# Patient Record
Sex: Female | Born: 1955 | Race: White | Hispanic: Refuse to answer | Marital: Married | Smoking: Never smoker
Health system: Southern US, Community
[De-identification: ages and names within clinical notes are randomized; demographics above are authoritative.]

## PROBLEM LIST (undated history)

## (undated) DIAGNOSIS — H509 Unspecified strabismus: Secondary | ICD-10-CM

## (undated) DIAGNOSIS — M1990 Unspecified osteoarthritis, unspecified site: Secondary | ICD-10-CM

## (undated) DIAGNOSIS — F419 Anxiety disorder, unspecified: Secondary | ICD-10-CM

## (undated) HISTORY — PX: DILATION AND CURETTAGE OF UTERUS: SHX78

---

## 1998-09-08 HISTORY — PX: STRABISMUS SURGERY: SHX218

## 2010-05-24 ENCOUNTER — Ambulatory Visit: Payer: Self-pay | Admitting: Internal Medicine

## 2010-05-24 DIAGNOSIS — J309 Allergic rhinitis, unspecified: Secondary | ICD-10-CM | POA: Insufficient documentation

## 2010-05-24 DIAGNOSIS — R5381 Other malaise: Secondary | ICD-10-CM

## 2010-05-24 DIAGNOSIS — G4700 Insomnia, unspecified: Secondary | ICD-10-CM

## 2010-05-24 DIAGNOSIS — R5383 Other fatigue: Secondary | ICD-10-CM

## 2010-05-24 DIAGNOSIS — F411 Generalized anxiety disorder: Secondary | ICD-10-CM | POA: Insufficient documentation

## 2010-05-27 LAB — CONVERTED CEMR LAB
Basophils Absolute: 0 10*3/uL (ref 0.0–0.1)
Basophils Relative: 0.2 % (ref 0.0–3.0)
Calcium: 9.8 mg/dL (ref 8.4–10.5)
Creatinine, Ser: 0.6 mg/dL (ref 0.4–1.2)
GFR calc non Af Amer: 117.7 mL/min (ref >60)
Hemoglobin: 14.7 g/dL (ref 12.0–15.0)
Lymphocytes Relative: 25.3 % (ref 12.0–46.0)
MCHC: 34.1 g/dL (ref 30.0–36.0)
Monocytes Relative: 10.4 % (ref 3.0–12.0)
Neutro Abs: 3.1 10*3/uL (ref 1.4–7.7)
Neutrophils Relative %: 64 % (ref 43.0–77.0)
RBC: 4.79 M/uL (ref 3.87–5.11)
Sodium: 142 meq/L (ref 135–145)
TSH: 0.88 microintl units/mL (ref 0.35–5.50)
WBC: 4.9 10*3/uL (ref 4.5–10.5)

## 2010-06-04 ENCOUNTER — Telehealth: Payer: Self-pay | Admitting: Internal Medicine

## 2010-06-12 ENCOUNTER — Telehealth: Payer: Self-pay | Admitting: Internal Medicine

## 2010-06-25 ENCOUNTER — Encounter: Payer: Self-pay | Admitting: Internal Medicine

## 2010-06-28 ENCOUNTER — Encounter: Admission: RE | Admit: 2010-06-28 | Discharge: 2010-06-28 | Payer: Self-pay | Admitting: Internal Medicine

## 2010-07-17 ENCOUNTER — Encounter: Payer: Self-pay | Admitting: Internal Medicine

## 2010-07-22 ENCOUNTER — Encounter: Admission: RE | Admit: 2010-07-22 | Discharge: 2010-07-22 | Payer: Self-pay | Admitting: Internal Medicine

## 2010-10-08 NOTE — Consult Note (Signed)
Summary: [facility]  [facility]   Imported By: Lanelle Bal 08/02/2010 10:00:55  _____________________________________________________________________  External Attachment:    Type:   Image     Comment:   External Document

## 2010-10-08 NOTE — Progress Notes (Signed)
Summary: zolpidem  Phone Note Call from Patient Call back at Home Phone [phone]   Summary of Call: Patient left message on triage that her current psychiatrist is going back to school and she had to find a new one. She has a new one, Dr. Evelene Croon and is scheduled for appt on 10/14. In the meantime he will not refill her Zolpidem. Patient would like to know if Drue Novel will refill. Please advise. Initial call taken by: Lucious Groves CMA,  June 12, 2010 10:31 AM  Follow-up for Phone Call        ok to call  #30 and one refill Follow-up by: [facility] E. Paz MD,  June 12, 2010 11:00 AM  Additional Follow-up for Phone Call Additional follow up Details #1::        Patient notified and [facility]s [facility] [address]. Additional Follow-up by: Lucious Groves CMA,  June 12, 2010 11:15 AM    Prescriptions: AMBIEN 10 MG TABS (ZOLPIDEM TARTRATE) 1 by mouth at qhs  #30 x 1   Entered by:   Lucious Groves CMA   Authorized by:   Nolon Rod. Paz MD   Signed by:   Lucious Groves CMA on 06/12/2010   Method used:   Telephoned to ...       [facility]  [address] [phone]* (retail)       [address]       [place], [place]  [address]       Ph: [phone] or [phone]       Fax: [phone]   RxID:   [id number]

## 2010-10-08 NOTE — Progress Notes (Signed)
Summary: referral  Phone Note Call from Patient   Caller: Patient Reason for Call: Referral Summary of Call: Pt called and states that she has been using the Veramyst and her symptoms are not getting any better and the Vermyst is not coverd by her insurance. Would like to know if she can be referred to see an allergist. Please advise. Army Fossa CMA  June 04, 2010 9:33 AM   Follow-up for Phone Call        arrange referal Nolon Rod. Paz MD  June 04, 2010 11:54 AM

## 2010-10-08 NOTE — Assessment & Plan Note (Signed)
Summary: NEW TO EST/KN   Vital Signs:  Patient profile:   55 year old female Height:      69 inches Weight:      159.13 pounds BMI:     23.58 Temp:     97.7 degrees F oral Pulse rate:   72 / minute Pulse rhythm:   regular BP sitting:   120 / 70  (left arm) Cuff size:   regular  Vitals Entered By: Army Fossa CMA (May 24, 2010 1:06 PM) CC: To establish, c/o Sinus infection?  Comments c/o HA, congestion " feels as if she has been run over by a truck." - feeling is off and on. 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   History of Present Illness: new patient   2 year history of on and off sinus congestion, frontal headache every 2 to 3  weeks. She has not taking any medication for this condition, has not seen the doctor or having x-rays rarely above sx are associated w/ extreme fatigue, feeling like "run by a truck" see ROS  Also has a history of insomnia, taking Ambien for while, lately he has not been working as well as before  [employer] about the need of a tetanus shot and flu shot Like a referral to a new female gynecologist periods for the last few months has been slightly different, check hormones?    ROS Denies fevers no myalgias-arthralgias  no N-V  Admits to mild cough with occasional phlegm Some postnasal dripping Denies itchy eyes or itchy nose Reports a long history of nasal septum deviation   Preventive Screening-Counseling & Management  Alcohol-Tobacco     Alcohol type: wine- rarely     Smoking Status: never  Caffeine-Diet-Exercise     Does Patient Exercise: yes     Times/week: 3      Drug Use:  no.    Current Medications (verified): 1)  Klonopin 1 Mg Tabs (Clonazepam) .... As Needed For Anxiety. 2)  Ambien 10 Mg Tabs (Zolpidem Tartrate) .Marland Kitchen.. 1 By Mouth At Qhs  Allergies (verified): No Known Drug Allergies  Past History:  Past Medical  History: very healthy  Anxiety  Past Surgical History: no major surgery   Family History: DM--no HTN--no high chol--no colon ca--no breast ca--no  Social History: Married no children  born in [place], family from [place]  moved from [place] , 2011 Never Smoked Alcohol use-yes, rarely diet-- veggan  Drug use-no Regular exercise-yes Smoking Status:  never Drug Use:  no Does Patient Exercise:  yes  Physical Exam  General:  alert, well-developed, and well-nourished.   Head:  face symmetric, not tender to palpation at the maxillary or frontal sinuses Ears:  R ear normal and L ear normal.   Nose:  not congested, no discharge, septum slightly deviated to the left Mouth:  no redness or discharge Lungs:  normal respiratory effort, no intercostal retractions, no accessory muscle use, and normal breath sounds.   Heart:  normal rate, regular rhythm, and no murmur.     Impression & Recommendations:  Problem # 1:  ALLERGIC RHINITIS (ICD-477.9) Chronic nasal congestion on and off She has pets at home  for a long time She recently moved from [facility] to [place] without a change in her symptoms Most likely she has allergies, we'll try veramyst  and Claritin consider a referral if not better  Her updated medication list for this  problem includes:    Veramyst 27.5 Mcg/spray Susp (Fluticasone furoate) .Marland Kitchen... 2 sprays once daily    Claritin 10 Mg Tabs (Loratadine) .Marland Kitchen... 1 by mouth daily at night  Problem # 2:  ? of MENOPAUSE, EARLY (ICD-627.2) refer to gynecology  Problem # 3:  ROUTINE GENERAL MEDICAL EXAM@HEALTH  CARE FACL (ICD-V70.0)  TD today Flu shot today referred to gynecology Request a mammogram referral as well  Orders: Gynecologic Referral (Gyn) Radiology Referral (Radiology)  Problem # 4:  ANXIETY (ICD-300.00) symptoms apparently well controlled Her updated medication list for this problem includes:    Klonopin 1 Mg Tabs (Clonazepam) .Marland Kitchen... As needed for  anxiety.  Problem # 5:  INSOMNIA-SLEEP DISORDER-UNSPEC (ICD-780.52) on Ambien for while, apparently not as effective as before. She was stressed out because her recent move from [place] to [place] but lately she is settling down. Gave information about healthy sleep habits Her updated medication list for this problem includes:    Ambien 10 Mg Tabs (Zolpidem tartrate) .Marland Kitchen... 1 by mouth at qhs  Problem # 6:  FATIGUE (ICD-780.79)  see HPI, unclear etiology of episodic fatigue will get labs and re asses on RTC   Orders: Venipuncture (16109) TLB-BMP (Basic Metabolic Panel-BMET) (80048-METABOL) TLB-CBC Platelet - w/Differential (85025-CBCD) TLB-TSH (Thyroid Stimulating Hormone) (84443-TSH) Specimen Handling (60454)  Complete Medication List: 1)  Klonopin 1 Mg Tabs (Clonazepam) .... As needed for anxiety. 2)  Ambien 10 Mg Tabs (Zolpidem tartrate) .Marland Kitchen.. 1 by mouth at qhs 3)  Veramyst 27.5 Mcg/spray Susp (Fluticasone furoate) .... 2 sprays once daily 4)  Claritin 10 Mg Tabs (Loratadine) .Marland Kitchen.. 1 by mouth daily at night  Other Orders: Tdap => 29yrs IM (09811) Admin 1st Vaccine (91478) Admin 1st Vaccine (29562) Flu Vaccine 80yrs + [phone])   Patient Instructions: 1)  call in to 4 to 6 weeks if your sinus symptoms are no better 2)  Please schedule a follow-up appointment in 3 to 4 months fasting, physical exam Prescriptions: VERAMYST 27.5 MCG/SPRAY SUSP (FLUTICASONE FUROATE) 2 sprays once daily  #1 x 6   Entered and Authorized by:   Elita Quick E. Shontae Rosiles MD   Signed by:   Nolon Rod. Maycel Riffe MD on 05/24/2010   Method used:   Print then Give to Patient   RxID:   [id number]    Risk Factors:  Tobacco use:  never Drug use:  no Alcohol use:  yes    Type:  wine- rarely Exercise:  yes    Times per week:  3    Immunizations Administered:  Tetanus Vaccine:    Vaccine Type: Tdap    Site: right deltoid    Mfr: GlaxoSmithKline    Dose: 0.5 ml    Route: IM    Given by: Army Fossa CMA     Exp. Date: 05/29/2012    Lot #: [id number] Flu Vaccine Consent Questions     Do you have a history of severe allergic reactions to this vaccine? no    Any prior history of allergic reactions to egg and/or gelatin? no    Do you have a sensitivity to the preservative Thimersol? no    Do you have a past history of Guillan-Barre Syndrome? no    Do you currently have an acute febrile illness? no    Have you ever had a severe reaction to latex? no    Vaccine information given and explained to patient? yes    Are you currently pregnant? no    Lot Number:[id number]   Exp Date:03/08/2011  Site Given  Left Deltoid IMu

## 2011-01-14 ENCOUNTER — Other Ambulatory Visit: Payer: Self-pay | Admitting: Obstetrics and Gynecology

## 2011-01-14 ENCOUNTER — Encounter (HOSPITAL_COMMUNITY): Payer: BC Managed Care – PPO

## 2011-01-14 LAB — BASIC METABOLIC PANEL
BUN: 11 mg/dL (ref 6–23)
CO2: 26 mEq/L (ref 19–32)
Calcium: 9 mg/dL (ref 8.4–10.5)
Creatinine, Ser: 0.62 mg/dL (ref 0.4–1.2)
GFR calc Af Amer: >60 mL/min (ref >60)
Glucose, Bld: 93 mg/dL (ref 70–99)

## 2011-01-14 LAB — CBC
HCT: 39.8 % (ref 36.0–46.0)
MCHC: 32.4 g/dL (ref 30.0–36.0)
RDW: 12.8 % (ref 11.5–15.5)

## 2011-01-14 LAB — URINALYSIS, ROUTINE W REFLEX MICROSCOPIC
Bilirubin Urine: NEGATIVE
Hgb urine dipstick: NEGATIVE
Ketones, ur: NEGATIVE mg/dL
Protein, ur: NEGATIVE mg/dL
Urobilinogen, UA: 0.2 mg/dL (ref 0.0–1.0)

## 2011-01-21 ENCOUNTER — Ambulatory Visit (HOSPITAL_COMMUNITY)
Admission: RE | Admit: 2011-01-21 | Discharge: 2011-01-21 | Disposition: A | Discharge status: Home / Self Care | Payer: BC Managed Care – PPO | Source: Ambulatory Visit | Attending: Obstetrics and Gynecology | Admitting: Obstetrics and Gynecology

## 2011-01-21 ENCOUNTER — Other Ambulatory Visit: Payer: Self-pay

## 2011-01-21 DIAGNOSIS — Z01812 Encounter for preprocedural laboratory examination: Secondary | ICD-10-CM | POA: Insufficient documentation

## 2011-01-21 DIAGNOSIS — D250 Submucous leiomyoma of uterus: Secondary | ICD-10-CM | POA: Insufficient documentation

## 2011-01-21 DIAGNOSIS — N950 Postmenopausal bleeding: Secondary | ICD-10-CM | POA: Insufficient documentation

## 2011-01-21 DIAGNOSIS — Z01818 Encounter for other preprocedural examination: Secondary | ICD-10-CM | POA: Insufficient documentation

## 2011-02-15 NOTE — Op Note (Signed)
NAMEARIELIZ, LATINO NO.:  [id number]  MEDICAL RECORD NO.:  [id number]           PATIENT TYPE:  O  LOCATION:  [facility]                          [facility]  PHYSICIAN:  Randye Lobo, M.D.   DATE OF BIRTH:  Mar 04, 1956  DATE OF PROCEDURE:  01/21/2011 DATE OF DISCHARGE:                              OPERATIVE REPORT   PREOPERATIVE DIAGNOSIS:  Postmenopausal bleeding.  POSTOPERATIVE DIAGNOSES: 1. Postmenopausal bleeding. 2. Submucosal fibroid.  PROCEDURES:  Hysteroscopy, dilation and curettage, and resection of submucosal fibroid.  SURGEON:  Randye Lobo, MD  ANESTHESIA:  General endotracheal.  INTRAVENOUS FLUIDS:  2000 mL Ringer's lactate.  ESTIMATED BLOOD LOSS:  Minimal.  URINE OUTPUT:  I and O catheterization prior to procedure, 1.5% glycine, deficit 255 mL.  COMPLICATIONS:  None.  INDICATIONS FOR PROCEDURE:  The patient is a 55 year old, gravida 1, para 0-0-1-0 Caucasian female who presented with postmenopausal bleeding.  The patient was seen in the office were she had initiation of estrogen and progesterone hormone therapy for menopausal symptoms and very soon thereafter when she developed an episode of very heavy vaginal bleeding.  Ultrasound in the office on December 09, 2010, documented echogenic areas of the endometrium which were thought to be consistent with either polyps or clot within the endometrial cavity.  She also had a 4-cm posterior lower uterine segment fibroid and a complex mostly solid the area of the right ovary.  An attempt was made to perform an endometrial biopsy in the office, however, this procedure was abandoned due to a stenotic cervix.  The patient had a followup ultrasound on Jan 16, 2011, which documented an endometrial stripe of 17 mm with complex fluid in the endometrial canal.  The fibroid was still visible and both of the ovaries appeared normal at this time.  A plan was made to proceed with a hysteroscopy with  dilation and curettage after risks, benefits, and alternatives were reviewed.  FINDINGS:  Examination under anesthesia revealed a small retroverted mobile uterus.  No adnexal masses were appreciated.  Hysteroscopy demonstrated a 2-cm bilobed submucosal fibroid which was attached to the posterior and left fundus.  After resection of the fibroid, there was no evidence of any intracavitary pathology.  The endocervix was normal.  SPECIMENS:  Fragments of the uterine fibroid were sent to Pathology separately from the endometrial curetting specimen.  PROCEDURE:  The patient was reidentified in the preoperative hold area. She received cefotetan IV for antibiotic prophylaxis and she received TED hose for DVT prophylaxis.  In the operating room, general endotracheal anesthesia was induced.  The patient was placed in the dorsal lithotomy position.  The lower abdomen, vagina, and perineum were then sterilely prepped and she was in-and-out catheterized of urine.  The patient was then sterilely draped.  An exam under anesthesia was performed.  A speculum was placed inside the vagina.  A single-tooth tenaculum was placed on the anterior cervical lip.  The cervix was dilated to a #25 Pratt dilator.  The 2-mm diagnostic hysteroscope was inserted with a continuous infusion of glycine solution.  The findings were as noted above.  The cervix was dilated further  in order to allow of a ring forceps to attempt to remove the intracavitary lesion which initially it was not clear if it was a polyp or fibroid.  Due to the solid nature of the texture of the intracavitary lesion, the cervix was then dilated up to a #31 Pratt dilators so that the resectoscope could be placed inside the uterine cavity.  This was done so under the continuous infusion of 1.5% glycine solution.  A resectoscopic loop and cutting setting were then used to shave the pieces of intracavitary fibroid until the entire lesion had been  resected.  The resectoscope was removed and the fragments were extracted using a polyp forceps.  The specimen was sent to Pathology. The resectoscope was inserted one final time and there were no remaining pieces of fibroid present.  A serrated curette was then used to curette the endometrium in the areas which did not involve the fibroid.  The endometrial curettings were sent to Pathology separately.  The cervix was examined at this time and the small laceration was repaired with a figure-of-eight suture of 0 chromic, which created excellent hemostasis.  The procedure was concluded.  The patient was awakened and extubated. She was escorted to the recovery room in stable condition.  There were no complications to the procedure.  All needle, instrument, and sponge counts were correct.     Randye Lobo, M.D.     BES/MEDQ  D:  01/21/2011  T:  01/21/2011  Job:  [id number]  Electronically Signed by Conley Simmonds M.D. on 02/15/2011 01:26:55 PM

## 2012-04-02 ENCOUNTER — Other Ambulatory Visit: Payer: Self-pay

## 2012-05-03 ENCOUNTER — Other Ambulatory Visit: Payer: Self-pay | Admitting: Obstetrics and Gynecology

## 2012-05-03 DIAGNOSIS — R928 Other abnormal and inconclusive findings on diagnostic imaging of breast: Secondary | ICD-10-CM

## 2012-05-06 ENCOUNTER — Ambulatory Visit
Admission: RE | Admit: 2012-05-06 | Discharge: 2012-05-06 | Disposition: A | Discharge status: Home / Self Care | Payer: 59 | Source: Ambulatory Visit | Attending: Obstetrics and Gynecology | Admitting: Obstetrics and Gynecology

## 2012-05-06 DIAGNOSIS — R928 Other abnormal and inconclusive findings on diagnostic imaging of breast: Secondary | ICD-10-CM

## 2012-05-07 IMAGING — US BILAT BREASTS
1 series · 5 of 5 positions shown · non-contrast
Comparison: 03/02/2008, 03/02/2007, 12/11/2003 from [facility]
Radiology in [place], [place]

CLINICAL DATA: The patient returns for evaluation of a possible
mass in each breast noted on recent screening study dated
06/28/2010.

DIGITAL DIAGNOSTIC BILATERAL LIMITED MAMMOGRAM  AND BILATERAL
BREAST ULTRASOUND:

[Series 1: bilat breasts · 5 of 5 slices shown]
[im 1/5]
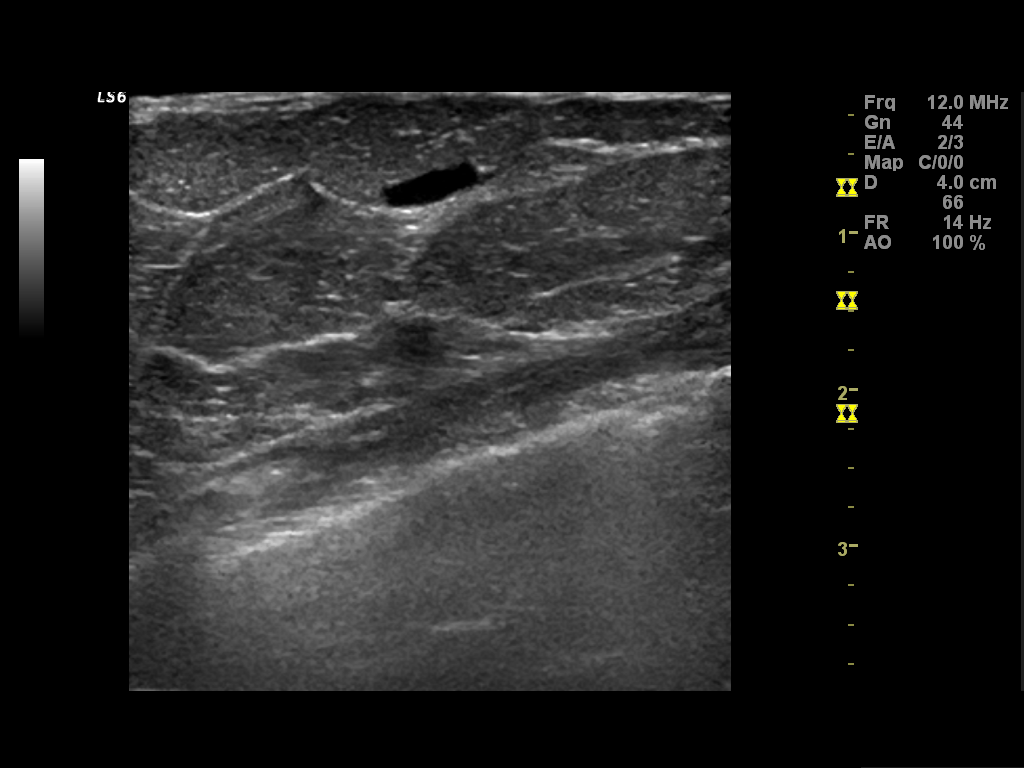
[im 2/5]
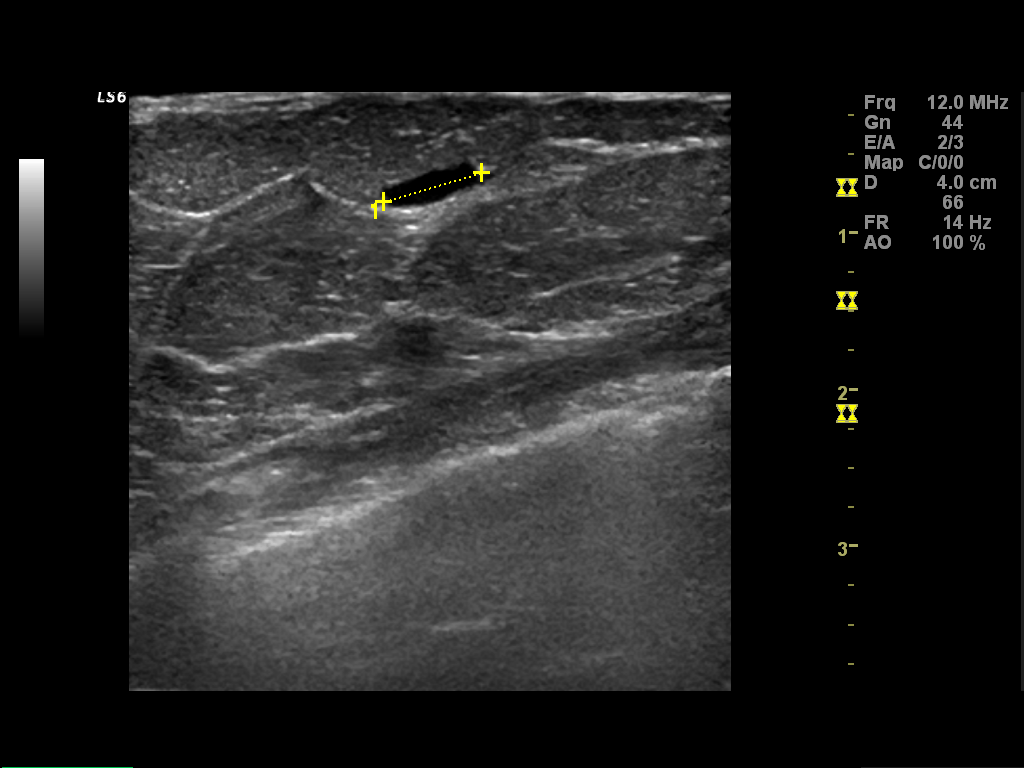
[im 3/5]
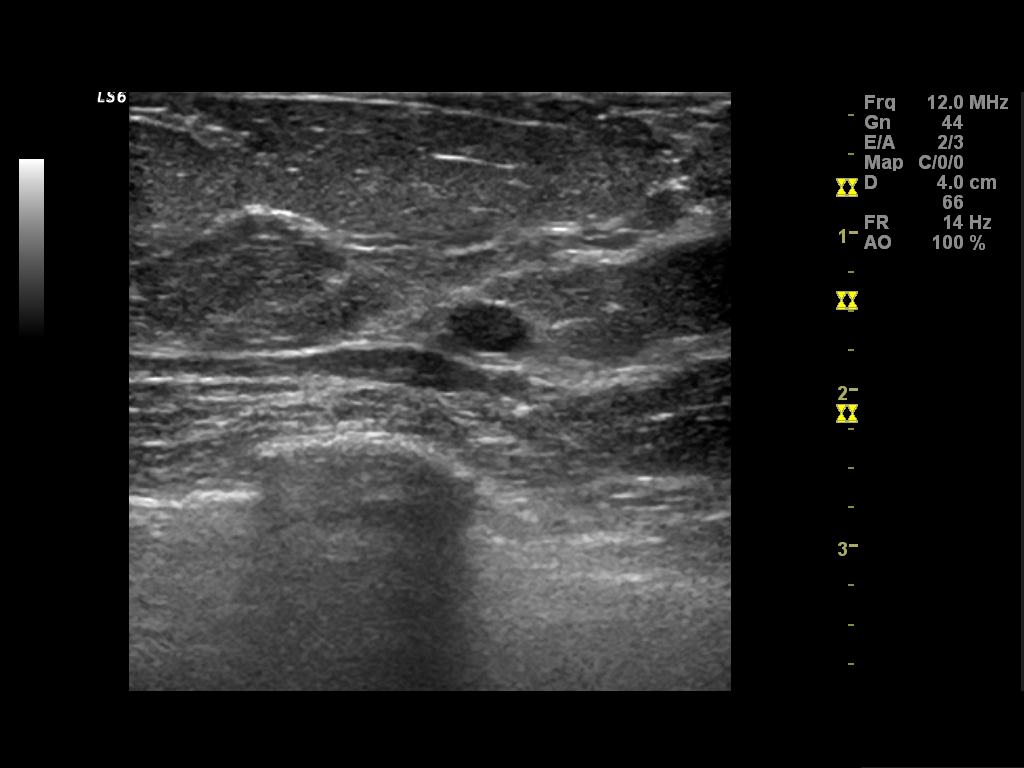
[im 4/5]
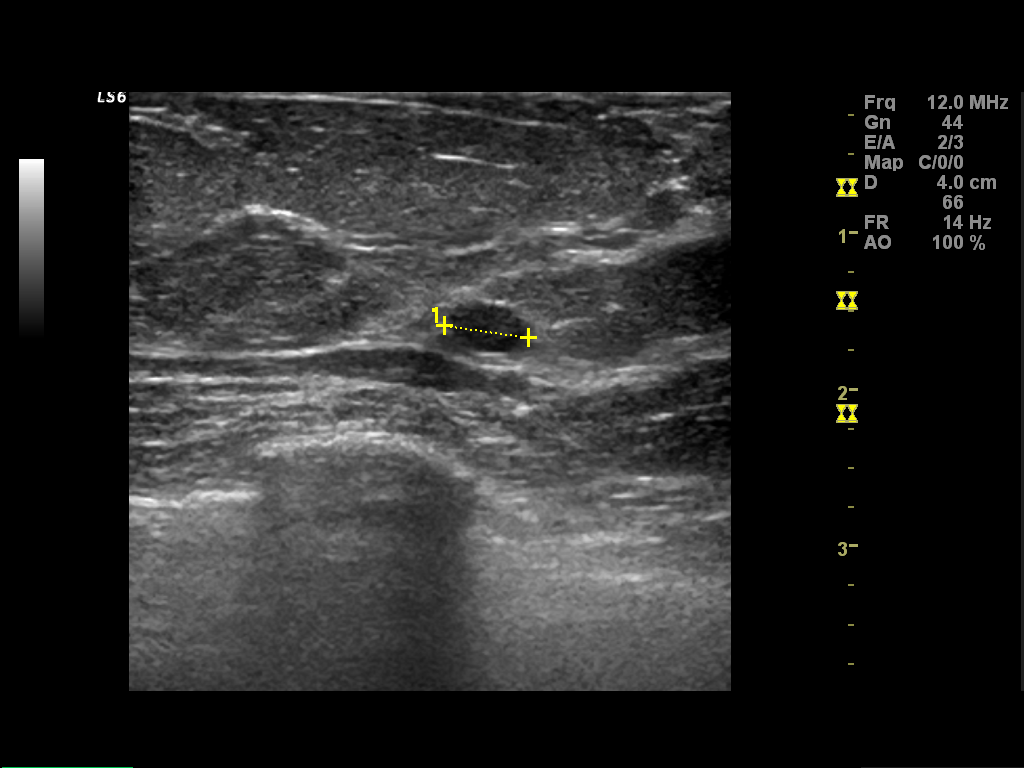
[im 5/5]
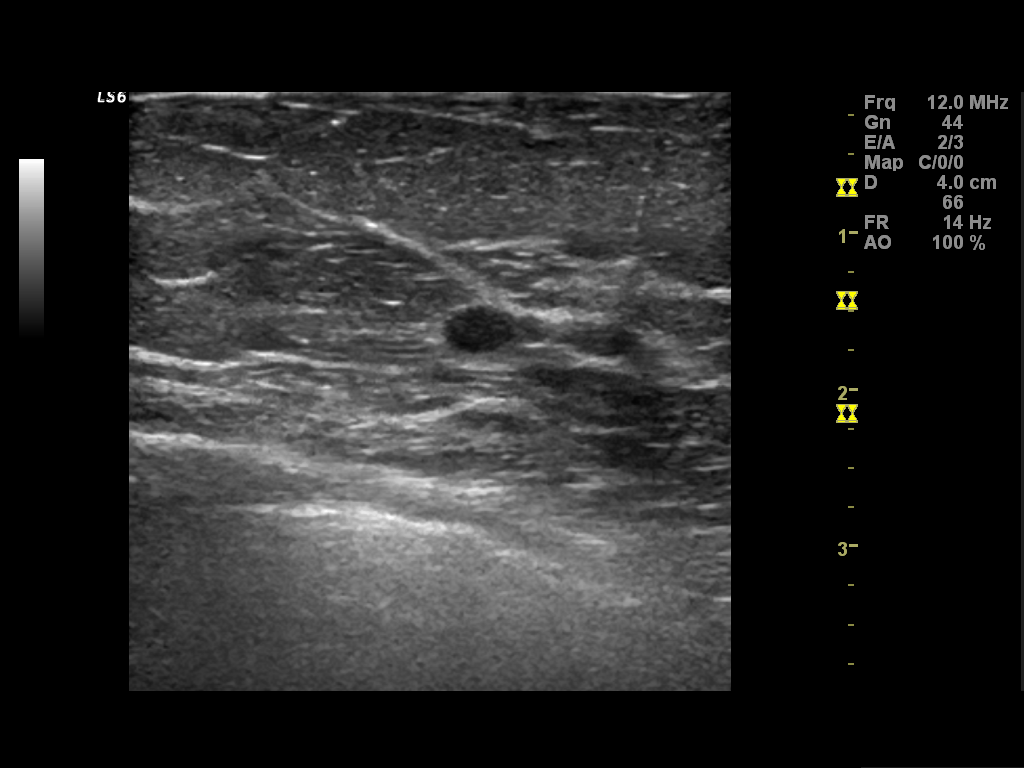

[5 of 5 positions shown; findings below may reference images not displayed]

FINDINGS: Spot compression views confirm the presence of a small
oval nodule in the right lower inner quadrant and in the left upper
inner quadrant.

On physical exam, no mass is palpated in either breast.

Ultrasound is performed, showing a 7 mm cyst at 4 o'clock 5 cm from
the right nipple and a 5 mm cyst at 10 o'clock 4 cm from the left
nipple.  There is no solid mass, distortion or shadowing to suggest
malignancy.
IMPRESSION: Cyst in each breast.  No mammographic or sonographic evidence of
malignancy.  Yearly screening mammography is suggested.

BI-RADS CATEGORY 2:  Benign finding(s).

## 2013-09-08 HISTORY — PX: EYE SURGERY: SHX253

## 2014-01-13 ENCOUNTER — Other Ambulatory Visit: Payer: Self-pay

## 2014-08-07 ENCOUNTER — Ambulatory Visit: Payer: Self-pay | Admitting: Ophthalmology

## 2014-08-07 NOTE — H&P (Signed)
  Date of examination:  07-19-14  Indication for surgery: To straighten the eyes and allow some binocularity and relieve diplopia    Pertinent past medical history: No past medical history on file.   Anxiety  Pertinent ocular history:  Hx high myopia, s/p refractive lensectomy.  Hx strabismus surgery 2000, details unavailable.  Pertinent family history: No family history on file.  General:  Healthy appearing patient in no distress.    Eyes:    Acuity [place]  OD 20/25  OS 20/40  External: Within normal limits     Anterior segment: Within normal limits x PC IOL OU     Motility:   E(T)=12, E(T)' with readers = 8.  Trial of prism likes 12 BO best  Fundus: deferred  Refraction:  low minus ou   Heart: Regular rate and rhythm without murmur     Lungs: Clear to auscultation     Abdomen: Soft, nontender, normal bowel sounds     Impression:Esotropia, ? Residual vs. Consecutive, s/p previous strabismus surgery details unavailable  Plan: LLR resect (tentative)  Derry Skill

## 2014-08-09 ENCOUNTER — Encounter (HOSPITAL_BASED_OUTPATIENT_CLINIC_OR_DEPARTMENT_OTHER): Payer: Self-pay | Admitting: *Deleted

## 2014-08-11 ENCOUNTER — Ambulatory Visit (HOSPITAL_BASED_OUTPATIENT_CLINIC_OR_DEPARTMENT_OTHER)
Admission: RE | Admit: 2014-08-11 | Discharge: 2014-08-11 | Disposition: A | Discharge status: Home / Self Care | Payer: 59 | Source: Ambulatory Visit | Attending: Ophthalmology | Admitting: Ophthalmology

## 2014-08-11 ENCOUNTER — Encounter (HOSPITAL_BASED_OUTPATIENT_CLINIC_OR_DEPARTMENT_OTHER): Payer: Self-pay | Admitting: *Deleted

## 2014-08-11 ENCOUNTER — Ambulatory Visit (HOSPITAL_BASED_OUTPATIENT_CLINIC_OR_DEPARTMENT_OTHER): Payer: 59 | Admitting: Anesthesiology

## 2014-08-11 ENCOUNTER — Encounter (HOSPITAL_BASED_OUTPATIENT_CLINIC_OR_DEPARTMENT_OTHER)
Admission: RE | Disposition: A | Discharge status: Home / Self Care | Payer: Self-pay | Source: Ambulatory Visit | Attending: Ophthalmology

## 2014-08-11 DIAGNOSIS — H5000 Unspecified esotropia: Secondary | ICD-10-CM | POA: Diagnosis present

## 2014-08-11 DIAGNOSIS — H532 Diplopia: Secondary | ICD-10-CM | POA: Diagnosis not present

## 2014-08-11 DIAGNOSIS — F419 Anxiety disorder, unspecified: Secondary | ICD-10-CM | POA: Diagnosis not present

## 2014-08-11 HISTORY — DX: Unspecified strabismus: H50.9

## 2014-08-11 HISTORY — DX: Unspecified osteoarthritis, unspecified site: M19.90

## 2014-08-11 HISTORY — DX: Anxiety disorder, unspecified: F41.9

## 2014-08-11 HISTORY — PX: STRABISMUS SURGERY: SHX218

## 2014-08-11 LAB — POCT HEMOGLOBIN-HEMACUE: HEMOGLOBIN: 15.5 g/dL — AB (ref 12.0–15.0)

## 2014-08-11 SURGERY — REPAIR STRABISMUS
Anesthesia: General | Site: Eye | Laterality: Left

## 2014-08-11 MED ORDER — ATROPINE SULFATE 0.4 MG/ML IJ SOLN
INTRAMUSCULAR | Status: DC | PRN
  Administered 2014-08-11: .2 mg via INTRAVENOUS

## 2014-08-11 MED ORDER — MIDAZOLAM HCL 5 MG/5ML IJ SOLN
INTRAMUSCULAR | Status: DC | PRN
  Administered 2014-08-11: 2 mg via INTRAVENOUS

## 2014-08-11 MED ORDER — MEPERIDINE HCL 25 MG/ML IJ SOLN: 6.2500 mg | INTRAMUSCULAR | Status: DC | PRN

## 2014-08-11 MED ORDER — HYDROMORPHONE HCL 1 MG/ML IJ SOLN
INTRAMUSCULAR | Status: AC
  Filled 2014-08-11: qty 1

## 2014-08-11 MED ORDER — FENTANYL CITRATE 0.05 MG/ML IJ SOLN: 50.0000 ug | INTRAMUSCULAR | Status: DC | PRN

## 2014-08-11 MED ORDER — FENTANYL CITRATE 0.05 MG/ML IJ SOLN
INTRAMUSCULAR | Status: DC | PRN
  Administered 2014-08-11: 100 ug via INTRAVENOUS

## 2014-08-11 MED ORDER — ONDANSETRON HCL 4 MG/2ML IJ SOLN
INTRAMUSCULAR | Status: DC | PRN
  Administered 2014-08-11: 4 mg via INTRAVENOUS

## 2014-08-11 MED ORDER — MIDAZOLAM HCL 2 MG/2ML IJ SOLN: 1.0000 mg | INTRAMUSCULAR | Status: DC | PRN

## 2014-08-11 MED ORDER — OXYCODONE HCL 5 MG/5ML PO SOLN: 5.0000 mg | Freq: Once | ORAL | Status: DC | PRN

## 2014-08-11 MED ORDER — OXYCODONE HCL 5 MG PO TABS: 5.0000 mg | ORAL_TABLET | Freq: Once | ORAL | Status: DC | PRN

## 2014-08-11 MED ORDER — LIDOCAINE HCL (CARDIAC) 20 MG/ML IV SOLN
INTRAVENOUS | Status: DC | PRN
  Administered 2014-08-11: 50 mg via INTRAVENOUS

## 2014-08-11 MED ORDER — PROPOFOL 10 MG/ML IV BOLUS
INTRAVENOUS | Status: DC | PRN
  Administered 2014-08-11: 250 mg via INTRAVENOUS

## 2014-08-11 MED ORDER — ONDANSETRON HCL 4 MG/2ML IJ SOLN: 4.0000 mg | Freq: Once | INTRAMUSCULAR | Status: DC | PRN

## 2014-08-11 MED ORDER — TOBRAMYCIN-DEXAMETHASONE 0.3-0.1 % OP OINT
TOPICAL_OINTMENT | OPHTHALMIC | Status: DC | PRN
  Administered 2014-08-11: 1 via OPHTHALMIC

## 2014-08-11 MED ORDER — KETOROLAC TROMETHAMINE 30 MG/ML IJ SOLN
INTRAMUSCULAR | Status: DC | PRN
  Administered 2014-08-11: 30 mg via INTRAVENOUS

## 2014-08-11 MED ORDER — MIDAZOLAM HCL 2 MG/2ML IJ SOLN
INTRAMUSCULAR | Status: AC
  Filled 2014-08-11: qty 2

## 2014-08-11 MED ORDER — HYDROMORPHONE HCL 1 MG/ML IJ SOLN
0.2500 mg | INTRAMUSCULAR | Status: DC | PRN
  Administered 2014-08-11: 0.5 mg via INTRAVENOUS

## 2014-08-11 MED ORDER — LACTATED RINGERS IV SOLN
INTRAVENOUS | Status: DC
  Administered 2014-08-11: 09:00:00 via INTRAVENOUS

## 2014-08-11 MED ORDER — EPINEPHRINE HCL 1 MG/ML IJ SOLN
INTRAMUSCULAR | Status: DC | PRN
  Administered 2014-08-11: 10 mg via INTRAVENOUS

## 2014-08-11 MED ORDER — FENTANYL CITRATE 0.05 MG/ML IJ SOLN
INTRAMUSCULAR | Status: AC
  Filled 2014-08-11: qty 4

## 2014-08-11 MED ORDER — DEXAMETHASONE SODIUM PHOSPHATE 4 MG/ML IJ SOLN
INTRAMUSCULAR | Status: DC | PRN
  Administered 2014-08-11: 10 mg via INTRAVENOUS

## 2014-08-11 MED ORDER — OXYCODONE-ACETAMINOPHEN 7.5-325 MG PO TABS: 1.0000 | ORAL_TABLET | ORAL | Status: AC | PRN

## 2014-08-11 SURGICAL SUPPLY — 31 items
APPLICATOR COTTON TIP 6IN STRL (MISCELLANEOUS) ×12 IMPLANT
APPLICATOR DR MATTHEWS STRL (MISCELLANEOUS) ×3 IMPLANT
BANDAGE EYE OVAL (MISCELLANEOUS) IMPLANT
CAUTERY EYE LOW TEMP 1300F FIN (OPHTHALMIC RELATED) IMPLANT
CLOSURE WOUND 1/4X4 (GAUZE/BANDAGES/DRESSINGS)
COVER BACK TABLE 60X90IN (DRAPES) ×3 IMPLANT
COVER MAYO STAND STRL (DRAPES) ×3 IMPLANT
DRAPE SURG 17X23 STRL (DRAPES) ×6 IMPLANT
DRAPE U-SHAPE 76X120 STRL (DRAPES) ×3 IMPLANT
GLOVE BIO SURGEON STRL SZ 6.5 (GLOVE) ×2 IMPLANT
GLOVE BIO SURGEONS STRL SZ 6.5 (GLOVE) ×1
GLOVE BIOGEL M STRL SZ7.5 (GLOVE) ×9 IMPLANT
GOWN STRL REUS W/ TWL LRG LVL3 (GOWN DISPOSABLE) ×1 IMPLANT
GOWN STRL REUS W/TWL LRG LVL3 (GOWN DISPOSABLE) ×2
GOWN STRL REUS W/TWL XL LVL3 (GOWN DISPOSABLE) ×3 IMPLANT
NS IRRIG 1000ML POUR BTL (IV SOLUTION) ×3 IMPLANT
PACK BASIN DAY SURGERY FS (CUSTOM PROCEDURE TRAY) ×3 IMPLANT
SHEET MEDIUM DRAPE 40X70 STRL (DRAPES) IMPLANT
SLEEVE SCD COMPRESS KNEE MED (MISCELLANEOUS) ×3 IMPLANT
SPEAR EYE SURG WECK-CEL (MISCELLANEOUS) ×6 IMPLANT
STRIP CLOSURE SKIN 1/4X4 (GAUZE/BANDAGES/DRESSINGS) IMPLANT
SUT 6 0 SILK T G140 8DA (SUTURE) IMPLANT
SUT MERSILENE 6 0 S14 DA (SUTURE) IMPLANT
SUT PLAIN 6 0 TG1408 (SUTURE) ×3 IMPLANT
SUT SILK 4 0 C 3 735G (SUTURE) IMPLANT
SUT VICRYL 6 0 S 28 (SUTURE) ×3 IMPLANT
SUT VICRYL ABS 6-0 S29 18IN (SUTURE) IMPLANT
SYR TB 1ML LL NO SAFETY (SYRINGE) ×3 IMPLANT
SYRINGE 10CC LL (SYRINGE) ×3 IMPLANT
TOWEL OR 17X24 6PK STRL BLUE (TOWEL DISPOSABLE) ×3 IMPLANT
TRAY DSU PREP LF (CUSTOM PROCEDURE TRAY) ×3 IMPLANT

## 2014-08-11 NOTE — Anesthesia Procedure Notes (Signed)
Procedure Name: LMA Insertion Performed by: Terrance Mass Pre-anesthesia Checklist: Patient identified, Timeout performed, Emergency Drugs available and Suction available Patient Re-evaluated:Patient Re-evaluated prior to inductionOxygen Delivery Method: Circle system utilized Preoxygenation: Pre-oxygenation with 100% oxygen Intubation Type: IV induction Ventilation: Mask ventilation without difficulty LMA: LMA flexible inserted LMA Size: 4.0 Number of attempts: 1 Placement Confirmation: positive ETCO2 and breath sounds checked- equal and bilateral Tube secured with: Tape Dental Injury: Teeth and Oropharynx as per pre-operative assessment

## 2014-08-11 NOTE — Anesthesia Preprocedure Evaluation (Signed)

## 2014-08-11 NOTE — Discharge Instructions (Signed)
Diet: Clear liquids, advance to soft foods then regular diet as tolerated.  Pain control:   1)  Ibuprofen 600 mg by mouth every 6-8 hours as needed for pain  2)  Percocet 7.5/325 one or two by mouth as needed every 4-6 hours as needed  for pain that is not resolved by ibuprofen  Eye medications:  None unless directed by Dr. Annamaria Boots   Activity: No swimming for 1 week.  It is OK to let water run over the face and eyes while showering or taking a bath, even during the first week.  No other restriction on exercise or activity.  Call Dr. Janee Morn office [phone] with any problems or concerns.   Post Anesthesia Home Care Instructions  Activity: Get plenty of rest for the remainder of the day. A responsible adult should stay with you for 24 hours following the procedure.  For the next 24 hours, DO NOT: -Drive a car -Paediatric nurse -Drink alcoholic beverages -Take any medication unless instructed by your physician -Make any legal decisions or sign important papers.  Meals: Start with liquid foods such as gelatin or soup. Progress to regular foods as tolerated. Avoid greasy, spicy, heavy foods. If nausea and/or vomiting occur, drink only clear liquids until the nausea and/or vomiting subsides. Call your physician if vomiting continues.  Special Instructions/Symptoms: Your throat may feel dry or sore from the anesthesia or the breathing tube placed in your throat during surgery. If this causes discomfort, gargle with warm salt water. The discomfort should disappear within 24 hours.

## 2014-08-11 NOTE — Transfer of Care (Signed)
Immediate Anesthesia Transfer of Care Note  Patient: Mercedes Larson  Procedure(s) Performed: Procedure(s): REPAIR STRABISMUS LEFT EYE  (Left)  Patient Location: PACU  Anesthesia Type:General  Level of Consciousness: awake and sedated  Airway & Oxygen Therapy: Patient Spontanous Breathing and Patient connected to face mask oxygen  Post-op Assessment: Report given to PACU RN and Post -op Vital signs reviewed and stable  Post vital signs: Reviewed and stable  Complications: No apparent anesthesia complications

## 2014-08-11 NOTE — Anesthesia Postprocedure Evaluation (Signed)
Anesthesia Post Note  Patient: Mercedes Larson  Procedure(s) Performed: Procedure(s) (LRB): REPAIR STRABISMUS LEFT EYE  (Left)  Anesthesia type: general  Patient location: PACU  Post pain: Pain level controlled  Post assessment: Patient's Cardiovascular Status Stable  Last Vitals:  Filed Vitals:   08/11/14 1211  BP: 140/85  Pulse: 90  Temp: 36.5 C  Resp: 20    Post vital signs: Reviewed and stable  Level of consciousness: sedated  Complications: No apparent anesthesia complications

## 2014-08-11 NOTE — H&P (View-Only) (Signed)
  Date of examination:  07-19-14  Indication for surgery: To straighten the eyes and allow some binocularity and relieve diplopia    Pertinent past medical history: No past medical history on file.   Anxiety  Pertinent ocular history:  Hx high myopia, s/p refractive lensectomy.  Hx strabismus surgery 2000, details unavailable.  Pertinent family history: No family history on file.  General:  Healthy appearing patient in no distress.    Eyes:    Acuity [place]  OD 20/25  OS 20/40  External: Within normal limits     Anterior segment: Within normal limits x PC IOL OU     Motility:   E(T)=12, E(T)' with readers = 8.  Trial of prism likes 12 BO best  Fundus: deferred  Refraction:  low minus ou   Heart: Regular rate and rhythm without murmur     Lungs: Clear to auscultation     Abdomen: Soft, nontender, normal bowel sounds     Impression:Esotropia, ? Residual vs. Consecutive, s/p previous strabismus surgery details unavailable  Plan: LLR resect (tentative)  Derry Skill

## 2014-08-11 NOTE — Interval H&P Note (Signed)
History and Physical Interval Note:  08/11/2014 9:48 AM  Mercedes Larson  has presented today for surgery, with the diagnosis of ESOTROPIA  LEFT EYE  The various methods of treatment have been discussed with the patient and family. After consideration of risks, benefits and other options for treatment, the patient has consented to  Procedure(s): REPAIR STRABISMUS LEFT EYE  (Left) as a surgical intervention .  The patient's history has been reviewed, patient examined, no change in status, stable for surgery.  I have reviewed the patient's chart and labs.  Questions were answered to the patient's satisfaction.     Derry Skill

## 2014-08-11 NOTE — Op Note (Signed)
Preoperative diagnosis: Esotropia,  residual  Postoperative diagnosis: Same  Procedure: Lateral rectus muscle resection, 6.0 mm left eye(s)  Surgeon: Lorne Skeens. Annamaria Boots, M.D.  Anesthesia: General (laryngeal mask)  Complications: None  Description of procedure: After routine preoperative evaluation including informed consent from the mother (via an interpreter), the patient was taken to the operating room where She was identified by me. General anesthesia was induced without difficulty after placement of appropriate monitors. The patient was prepped and draped in standard sterile fashion. A lid speculum was placed in the left eye.  Through an inferotemporal fornix incision through conjunctiva and [place] fascia, the left lateral rectus muscle was engaged on a series of muscle hooks and carefully cleared of its fascial attachments. The muscle was spread between 2 self-retaining hooks. A 2 mm bite was taken of the center of the muscle belly at a measured distance of 6.0 mm posterior to the insertion. A knot was tied securely at this location. The needle at each end of the double-armed suture was passed from the center of the muscle belly to the periphery, parallel to and 6.0 mm posterior to the insertion. A double locking bite was placed at each border of the muscle. A resection clamp was placed on the muscle just anterior to the sutures. The muscle was disinserted. Each pole suture was passed posteriorly to anteriorly through the corresponding end of the muscle stump, then anteriorly to posteriorly near the center of the stump, then posteriorly to anteriorly through the center of the muscle belly, just posterior to the previously placed knot. The muscle was drawn up to the level of the original insertion, and all slack was removed before the suture ends were tied securely. The clamp was removed. The portion of the muscle anterior to the sutures was carefully excised. Conjunctiva was closed with one 6-0 plain  gut suture.  TobraDex ointment was placed in the left eye. The patient was awakened without difficulty and taken to the recovery room in stable condition, having suffered no intraoperative or immediate postoperative complications.  Lorne Skeens. Annamaria Boots, M.D.

## 2014-08-14 ENCOUNTER — Encounter (HOSPITAL_BASED_OUTPATIENT_CLINIC_OR_DEPARTMENT_OTHER): Payer: Self-pay | Admitting: Ophthalmology

## 2017-06-27 LAB — HM MAMMOGRAPHY

## 2019-11-29 LAB — HM MAMMOGRAPHY

## 2020-07-26 ENCOUNTER — Other Ambulatory Visit: Payer: Self-pay | Admitting: Dentistry

## 2020-07-26 DIAGNOSIS — M26632 Articular disc disorder of left temporomandibular joint: Secondary | ICD-10-CM

## 2020-08-01 ENCOUNTER — Other Ambulatory Visit: Payer: Self-pay

## 2020-08-01 ENCOUNTER — Ambulatory Visit
Admission: RE | Admit: 2020-08-01 | Discharge: 2020-08-01 | Disposition: A | Discharge status: Home / Self Care | Payer: 59 | Source: Ambulatory Visit | Attending: Dentistry | Admitting: Dentistry

## 2020-08-01 DIAGNOSIS — M26632 Articular disc disorder of left temporomandibular joint: Secondary | ICD-10-CM
